# Patient Record
Sex: Male | Born: 1965 | Race: White | Hispanic: No
Health system: Southern US, Community
[De-identification: ages and names within clinical notes are randomized; demographics above are authoritative.]

---

## 2005-05-16 ENCOUNTER — Ambulatory Visit: Payer: Self-pay | Admitting: Family Medicine

## 2019-09-22 ENCOUNTER — Other Ambulatory Visit: Payer: Self-pay

## 2019-09-22 ENCOUNTER — Emergency Department (HOSPITAL_COMMUNITY): Payer: No Typology Code available for payment source

## 2019-09-22 ENCOUNTER — Emergency Department (HOSPITAL_COMMUNITY)
Admission: EM | Admit: 2019-09-22 | Discharge: 2019-09-22 | Disposition: A | Discharge status: Home / Self Care | Payer: No Typology Code available for payment source | Attending: Emergency Medicine | Admitting: Emergency Medicine

## 2019-09-22 DIAGNOSIS — Y907 Blood alcohol level of 200-239 mg/100 ml: Secondary | ICD-10-CM | POA: Diagnosis not present

## 2019-09-22 DIAGNOSIS — Y93I9 Activity, other involving external motion: Secondary | ICD-10-CM | POA: Insufficient documentation

## 2019-09-22 DIAGNOSIS — R791 Abnormal coagulation profile: Secondary | ICD-10-CM | POA: Diagnosis not present

## 2019-09-22 DIAGNOSIS — S299XXA Unspecified injury of thorax, initial encounter: Secondary | ICD-10-CM | POA: Diagnosis present

## 2019-09-22 DIAGNOSIS — Y929 Unspecified place or not applicable: Secondary | ICD-10-CM | POA: Diagnosis not present

## 2019-09-22 DIAGNOSIS — R519 Headache, unspecified: Secondary | ICD-10-CM | POA: Diagnosis not present

## 2019-09-22 DIAGNOSIS — F10920 Alcohol use, unspecified with intoxication, uncomplicated: Secondary | ICD-10-CM | POA: Diagnosis not present

## 2019-09-22 DIAGNOSIS — S2242XA Multiple fractures of ribs, left side, initial encounter for closed fracture: Secondary | ICD-10-CM | POA: Insufficient documentation

## 2019-09-22 DIAGNOSIS — Y999 Unspecified external cause status: Secondary | ICD-10-CM | POA: Diagnosis not present

## 2019-09-22 DIAGNOSIS — R109 Unspecified abdominal pain: Secondary | ICD-10-CM | POA: Insufficient documentation

## 2019-09-22 LAB — CBC
HCT: 45.2 % (ref 39.0–52.0)
Hemoglobin: 15.4 g/dL (ref 13.0–17.0)
MCH: 34.3 pg — ABNORMAL HIGH (ref 26.0–34.0)
MCHC: 34.1 g/dL (ref 30.0–36.0)
MCV: 100.7 fL — ABNORMAL HIGH (ref 80.0–100.0)
Platelets: 259 10*3/uL (ref 150–400)
RBC: 4.49 MIL/uL (ref 4.22–5.81)
RDW: 12.2 % (ref 11.5–15.5)
WBC: 10.2 10*3/uL (ref 4.0–10.5)
nRBC: 0 % (ref 0.0–0.2)

## 2019-09-22 LAB — COMPREHENSIVE METABOLIC PANEL
ALT: 22 U/L (ref 0–44)
AST: 29 U/L (ref 15–41)
Albumin: 4 g/dL (ref 3.5–5.0)
Alkaline Phosphatase: 57 U/L (ref 38–126)
Anion gap: 13 (ref 5–15)
BUN: 8 mg/dL (ref 6–20)
CO2: 19 mmol/L — ABNORMAL LOW (ref 22–32)
Calcium: 9 mg/dL (ref 8.9–10.3)
Chloride: 108 mmol/L (ref 98–111)
Creatinine, Ser: 1.1 mg/dL (ref 0.61–1.24)
GFR calc Af Amer: >60 mL/min (ref >60)
GFR calc non Af Amer: >60 mL/min (ref >60)
Glucose, Bld: 105 mg/dL — ABNORMAL HIGH (ref 70–99)
Potassium: 3.3 mmol/L — ABNORMAL LOW (ref 3.5–5.1)
Sodium: 140 mmol/L (ref 135–145)
Total Bilirubin: 0.7 mg/dL (ref 0.3–1.2)
Total Protein: 6.7 g/dL (ref 6.5–8.1)

## 2019-09-22 LAB — I-STAT CHEM 8, ED
BUN: 7 mg/dL (ref 6–20)
Calcium, Ion: 1.12 mmol/L — ABNORMAL LOW (ref 1.15–1.40)
Chloride: 107 mmol/L (ref 98–111)
Creatinine, Ser: 1.3 mg/dL — ABNORMAL HIGH (ref 0.61–1.24)
Glucose, Bld: 98 mg/dL (ref 70–99)
HCT: 44 % (ref 39.0–52.0)
Hemoglobin: 15 g/dL (ref 13.0–17.0)
Potassium: 3.5 mmol/L (ref 3.5–5.1)
Sodium: 142 mmol/L (ref 135–145)
TCO2: 20 mmol/L — ABNORMAL LOW (ref 22–32)

## 2019-09-22 LAB — PROTIME-INR
INR: 0.9 (ref 0.8–1.2)
Prothrombin Time: 12.2 seconds (ref 11.4–15.2)

## 2019-09-22 LAB — SAMPLE TO BLOOD BANK

## 2019-09-22 LAB — ETHANOL: Alcohol, Ethyl (B): 232 mg/dL — ABNORMAL HIGH (ref <10)

## 2019-09-22 MED ORDER — IOHEXOL 300 MG/ML  SOLN
100.0000 mL | Freq: Once | INTRAMUSCULAR | Status: AC | PRN
  Administered 2019-09-22: 100 mL via INTRAVENOUS

## 2019-09-22 MED ORDER — SODIUM CHLORIDE 0.9 % IV BOLUS
1000.0000 mL | Freq: Once | INTRAVENOUS | Status: AC
  Administered 2019-09-22: 1000 mL via INTRAVENOUS
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
## 2019-09-22 NOTE — Discharge Instructions (Signed)
Take tylenol, motrin for pain. You have two rib fractures. Use incentive spirometer every 4 hrs for a week to prevent pneumonia   Avoid drinking alcohol   See your doctor   Return to ER if you have worse chest pain, shortness of breath, abdominal pain.

## 2019-09-22 NOTE — ED Notes (Signed)
Discharge instructions discussed with pt. Pt verbalized understanding. Pt stable and ambulatory. No signature pad available. 

## 2019-09-22 NOTE — Consult Note (Signed)
Responded to page, pt unavailable, no family present. Please page again if further need of chaplain services.  ° °Rev. Cherity Blickenstaff °Chaplain ° °
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
